# Patient Record
Sex: Female | Born: 1995 | Race: White | Hispanic: No | Marital: Single | Smoking: Never smoker
Health system: Southern US, Community
[De-identification: ages and names within clinical notes are randomized; demographics above are authoritative.]

## PROBLEM LIST (undated history)

## (undated) DIAGNOSIS — J45909 Unspecified asthma, uncomplicated: Secondary | ICD-10-CM

## (undated) DIAGNOSIS — N390 Urinary tract infection, site not specified: Secondary | ICD-10-CM

## (undated) DIAGNOSIS — R011 Cardiac murmur, unspecified: Secondary | ICD-10-CM

## (undated) HISTORY — DX: Unspecified asthma, uncomplicated: J45.909

## (undated) HISTORY — DX: Urinary tract infection, site not specified: N39.0

## (undated) HISTORY — DX: Cardiac murmur, unspecified: R01.1

---
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
## 2017-03-12 ENCOUNTER — Encounter: Payer: Self-pay | Admitting: Family Medicine

## 2017-03-12 ENCOUNTER — Ambulatory Visit (INDEPENDENT_AMBULATORY_CARE_PROVIDER_SITE_OTHER): Payer: PRIVATE HEALTH INSURANCE | Admitting: Family Medicine

## 2017-03-12 VITALS — BP 100/70 | HR 60 | Resp 12 | Ht 63.0 in | Wt 126.5 lb

## 2017-03-12 DIAGNOSIS — Z0000 Encounter for general adult medical examination without abnormal findings: Secondary | ICD-10-CM

## 2017-03-12 DIAGNOSIS — Z111 Encounter for screening for respiratory tuberculosis: Secondary | ICD-10-CM | POA: Diagnosis not present

## 2017-03-12 DIAGNOSIS — J4520 Mild intermittent asthma, uncomplicated: Secondary | ICD-10-CM

## 2017-03-12 MED ORDER — ALBUTEROL SULFATE HFA 108 (90 BASE) MCG/ACT IN AERS: 2.0000 | INHALATION_SPRAY | Freq: Four times a day (QID) | RESPIRATORY_TRACT | 1 refills | Status: AC | PRN

## 2017-03-12 NOTE — Progress Notes (Signed)
PPD Placement note Brandy CharonSarah Watkins, 21 y.o. female is here today for placement of PPD test Reason for PPD test: school Pt taken PPD test before: yes Verified in allergy area and with patient that they are not allergic to the products PPD is made of (Phenol or Tween). Yes Is patient taking any oral or IV steroid medication now or have they taken it in the last month? no Has the patient ever received the BCG vaccine?: no Has the patient been in recent contact with anyone known or suspected of having active TB disease?: no      Date of exposure (if applicable): n/a      Name of person they were exposed to (if applicable): n/a Patient's [place] of origin[place] O: Alert and oriented in NAD. P:  PPD placed on 03/12/2017.  Patient advised to return for reading within 48-72 hours.

## 2017-03-12 NOTE — Progress Notes (Signed)
HPI:   [name] is a 21 y.o. female, who is here today to establish care. She would like to have a routine physical today.  Former PCP: Peds, Dr Brandy Watkins Last preventive routine visit: 2-3 years ago.  Chronic medical problems: Asthma, she has mild exacerbations usually when she has a URI. Vaccines up to date.  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  She is planning on starting clinicals and needs form fill out and a 2 steps PPD.  She lives with parents during summer and Christmas break, she lives in [place] in [place]  M: 21 yo. G:0 She takes OCP to treat dysmenorrhea. Denies sexual activity.  She exercises regularly and follows a healthy diet.   Review of Systems  Constitutional: Negative for appetite change, fatigue, fever and unexpected weight change.  HENT: Negative for dental problem, hearing loss, nosebleeds, trouble swallowing and voice change.   Eyes: Negative for redness and visual disturbance.  Respiratory: Negative for cough, shortness of breath and wheezing.   Cardiovascular: Negative for chest pain and leg swelling.  Gastrointestinal: Negative for abdominal pain, blood in stool, nausea and vomiting.       No changes in bowel habits.  Endocrine: Negative for cold intolerance, heat intolerance, polydipsia, polyphagia and polyuria.  Genitourinary: Negative for decreased urine volume, dysuria, hematuria, vaginal bleeding and vaginal discharge.       No breast tenderness or nipple discharge.  Musculoskeletal: Negative for back pain, gait problem and myalgias.  Skin: Negative for rash.  Allergic/Immunologic: Positive for environmental allergies.  Neurological: Negative for syncope, weakness, numbness and headaches.  Hematological: Negative for adenopathy. Does not bruise/bleed easily.  Psychiatric/Behavioral: Negative for confusion and sleep disturbance. The patient is not nervous/anxious.    All other systems reviewed and are negative.   No current outpatient prescriptions on file prior to visit.   No current facility-administered medications on file prior to visit.     Past Medical History:  Diagnosis Date  . Asthma   . Heart murmur   . Urinary tract infection    No Known Allergies  Family History  Problem Relation Age of Onset  . Arthritis Mother   . Hyperlipidemia Mother   . Arthritis Father   . Hyperlipidemia Father     Social History   Social History  . Marital status: Single    Spouse name: N/A  . Number of children: N/A  . Years of education: N/A   Social History Main Topics  . Smoking status: Never Smoker  . Smokeless tobacco: Never Used  . Alcohol use No  . Drug use: No  . Sexual activity: Not Asked   Other Topics Concern  . None   Social History Narrative  . None    Vitals:   03/12/17 0914  BP: 100/70  Pulse: 60  Resp: 12   O2 sat at RA 98% Body mass index is 22.41 kg/m.   Physical Exam  Nursing note and vitals reviewed. Constitutional: She is oriented to person, place, and time. She appears well-developed and well-nourished. No distress.  HENT:  Head: Atraumatic.  Right Ear: Hearing, external ear and ear canal normal. Tympanic membrane is scarred.  Left Ear: Hearing, external ear and ear canal normal. Tympanic membrane is scarred.  Mouth/Throat: Uvula is midline, oropharynx is clear and moist and mucous membranes are normal.  Eyes: Pupils are equal, round, and reactive to light. Conjunctivae and EOM are  normal.  Neck: No tracheal deviation present. No thyroid mass and no thyromegaly present.  Cardiovascular: Normal rate and regular rhythm.   No murmur heard. Pulses:      Dorsalis pedis pulses are 2+ on the right side, and 2+ on the left side.  Respiratory: Effort normal and breath sounds normal. No respiratory distress.  GI: Soft. She exhibits no mass. There is no hepatomegaly. There is no tenderness.  Musculoskeletal:  She exhibits no edema or tenderness.  No major deformity or signs of synovitis appreciated.  Lymphadenopathy:    She has no cervical adenopathy.       Right: No supraclavicular adenopathy present.       Left: No supraclavicular adenopathy present.  Neurological: She is alert and oriented to person, place, and time. She has normal strength. No cranial nerve deficit. Coordination and gait normal.  Reflex Scores:      Bicep reflexes are 2+ on the right side and 2+ on the left side.      Patellar reflexes are 2+ on the right side and 2+ on the left side. Skin: Skin is warm. No rash noted. No erythema.  Psychiatric: She has a normal mood and affect. Her speech is normal.  Well groomed, good eye contact.     ASSESSMENT AND PLAN:    Brandy Watkins was seen today for establish care and annual exam.  Diagnoses and all orders for this visit:  Routine physical examination  We discussed the importance of regular physical activity and healthy diet for prevention of chronic illness and/or complications. Preventive guidelines reviewed. STD prevention and avoidance of risky behavior.  Vaccination up to date, she is due for Tdap in 06/2017. For filled out. PPD will be scheduled for next week and repeat in 2-3 weeks. Next CPE in 1-3 years.   Tuberculin skin test encounter -     TB Skin Test  Mild intermittent asthma without complication  Well controlled.. No changes in current management. F/U in 12 months.  -     albuterol (PROVENTIL HFA;VENTOLIN HFA) 108 (90 Base) MCG/ACT inhaler; Inhale 2 puffs into the lungs every 6 (six) hours as needed for wheezing or shortness of breath.      Betty G. [place], MD  [facility]. [place] office.

## 2017-03-12 NOTE — Patient Instructions (Signed)
A few things to remember from today's visit:   Routine physical examination  Tdap in 06/2017. Pap smear at age 21.   Please be sure medication list is accurate. If a new problem present, please set up appointment sooner than planned today.

## 2017-03-15 NOTE — Progress Notes (Signed)
PPD Reading Note  PPD read and results entered in EpicCare.  Result: 0  mm induration.  Interpretation:Negative   If test not read within 48-72 hours of initial placement, patient advised to repeat in other arm 1-3 weeks after this test.  Allergic reaction:No

## 2017-03-22 ENCOUNTER — Ambulatory Visit (INDEPENDENT_AMBULATORY_CARE_PROVIDER_SITE_OTHER): Payer: PRIVATE HEALTH INSURANCE

## 2017-03-22 DIAGNOSIS — Z227 Latent tuberculosis: Secondary | ICD-10-CM

## 2017-03-22 DIAGNOSIS — Z111 Encounter for screening for respiratory tuberculosis: Secondary | ICD-10-CM

## 2017-03-26 NOTE — Progress Notes (Signed)
PPD Reading Note PPD read and results entered in EpicCare. Result: 0 mm induration. Interpretation: negative If test not read within 48-72 hours of initial placement, patient advised to repeat in other arm 1-3 weeks after this test. Allergic reaction: No PPD was read on Wednesday 03/24/17.
